# Patient Record
Sex: Male | Born: 1990 | Race: White | Hispanic: No | Marital: Single | Smoking: Former smoker
Health system: Southern US, Community
[De-identification: ages and names within clinical notes are randomized; demographics above are authoritative.]

## PROBLEM LIST (undated history)

## (undated) HISTORY — PX: COLONOSCOPY: SHX174

---
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
## 2008-09-16 ENCOUNTER — Ambulatory Visit: Payer: Self-pay | Admitting: Family Medicine

## 2008-11-04 ENCOUNTER — Ambulatory Visit: Payer: Self-pay | Admitting: Internal Medicine
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
## 2013-11-27 ENCOUNTER — Ambulatory Visit: Payer: Self-pay | Admitting: Gastroenterology

## 2016-05-06 ENCOUNTER — Encounter: Payer: Self-pay | Admitting: Gynecology

## 2016-05-06 ENCOUNTER — Ambulatory Visit
Admission: EM | Admit: 2016-05-06 | Discharge: 2016-05-06 | Disposition: A | Discharge status: Home / Self Care | Payer: BLUE CROSS/BLUE SHIELD | Attending: Internal Medicine | Admitting: Internal Medicine

## 2016-05-06 DIAGNOSIS — L551 Sunburn of second degree: Secondary | ICD-10-CM | POA: Diagnosis not present

## 2016-05-06 MED ORDER — CEPHALEXIN 250 MG PO CAPS: 250.0000 mg | ORAL_CAPSULE | Freq: Four times a day (QID) | ORAL | Status: DC
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
## 2016-05-06 NOTE — Discharge Instructions (Signed)
Sunburn  Sunburn is skin damage from being out in the sun too long. If you have light or fair skin, you may get sunburned more easily. Getting sunburned over and over can cause wrinkles and dark spots on the skin (sun spots). It can also increase your chance of getting skin cancer. HOME CARE  Avoid being out in the sun until your sunburn is gone.  Take a cool bath to help lessen pain. Put a cold, damp washcloth on the sunburn to help lessen pain. Do not put ice on the sunburn.  Only take medicine as told by your doctor.  Use sunburn creams or gels on your skin but not on blisters.  Drink enough fluids to keep your pee (urine) clear or pale yellow.  Do not break blisters. If blisters break, your doctor may tell you to use a medicated cream on the area. To keep from getting sunburned:  Avoid the sun between 10:00 a.m. and 4:00 p.m. during the day.  Put sunscreen on 30 minutes before being in the sun.  Wear a hat, clothing, and sunglasses to protect against the sun.  Avoid medicines, herbs, and foods that make you more sensitive to sun.  Avoid tanning beds. GET HELP RIGHT AWAY IF:  You have a fever.  You have pain and medicine does not help.  You throw up (vomit) or have watery poop (diarrhea).  You feel like you will pass out (faint).  You have a headache and feel confused.  You have very bad blisters.  You have yellowish-white fluid (pus) coming from your blisters.  Your burn gets more painful and puffy (swollen). MAKE SURE YOU:  Understand these instructions.  Will watch your condition.  Will get help right away if you are not doing well or get worse.   This information is not intended to replace advice given to you by your health care provider. Make sure you discuss any questions you have with your health care provider.   Document Released: 08/16/2011 Document Revised: 03/31/2013 Document Reviewed: 06/07/2015 Elsevier Interactive Patient Education 2016 [employer]  
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
## 2017-08-16 ENCOUNTER — Encounter: Payer: Self-pay | Admitting: Emergency Medicine

## 2017-08-16 ENCOUNTER — Ambulatory Visit
Admission: EM | Admit: 2017-08-16 | Discharge: 2017-08-16 | Disposition: A | Discharge status: Home / Self Care | Payer: BLUE CROSS/BLUE SHIELD

## 2017-08-16 DIAGNOSIS — R220 Localized swelling, mass and lump, head: Secondary | ICD-10-CM

## 2017-08-16 DIAGNOSIS — R030 Elevated blood-pressure reading, without diagnosis of hypertension: Secondary | ICD-10-CM | POA: Diagnosis not present

## 2017-08-16 DIAGNOSIS — S161XXA Strain of muscle, fascia and tendon at neck level, initial encounter: Secondary | ICD-10-CM

## 2017-08-16 DIAGNOSIS — I10 Essential (primary) hypertension: Secondary | ICD-10-CM

## 2017-08-16 NOTE — ED Triage Notes (Signed)
Patient c/o facial swelling and pain on the right side that started this morning.

## 2017-08-16 NOTE — ED Provider Notes (Signed)
[facility]    CSN: [id number] Arrival date & time: 08/16/17  0818     History   Chief Complaint Chief Complaint  Patient presents with  . Facial Swelling    HPI Jose Butler is a 26 y.o. male.   HPI  A 26 year old male who presents with swelling over the right side of his neck just below his ear started this morning. He states that today he awoke with pain on that side went into the bathroom looked in the mirror and the swelling was quite significant according to the patient. From that time until now the  the swelling has decreased as has the pain. He came in to have it "just checked out". With further questioning the patient states that he has noticed pain on the side of his knee last week or so and also had some intrascapular pain as well. He states that his neck was stiff when he turned to the right is been under intense stress at work.He denies any upper extremity radicular symptoms and has had no neurological abnormalities.  His blood pressure was notably elevated today. The patient does not have a primary care physician but is encouraged to find one to assess trends in his blood pressure. Is a negative family history of blood pressure and his parents.      History reviewed. No pertinent past medical history.  There are no active problems to display for this patient.   Past Surgical History:  Procedure Laterality Date  . COLONOSCOPY         Home Medications    Prior to Admission medications   Not on File    Family History History reviewed. No pertinent family history.  Social History Social History  Substance Use Topics  . Smoking status: Former Games developermoker  . Smokeless tobacco: Never Used  . Alcohol use Yes     Allergies   Patient has no known allergies.   Review of Systems Review of Systems  Constitutional: Negative for activity change, chills, fatigue and fever.  Musculoskeletal: Positive for myalgias, neck pain and neck stiffness.    All other systems reviewed and are negative.    Physical Exam Triage Vital Signs ED Triage Vitals  Enc Vitals Group     BP 08/16/17 0827 (!) 160/98     Pulse Rate 08/16/17 0827 89     Resp 08/16/17 0827 16     Temp 08/16/17 0827 97.9 F (36.6 C)     Temp Source 08/16/17 0827 Oral     SpO2 08/16/17 0827 100 %     Weight 08/16/17 0826 280 lb (127 kg)     Height 08/16/17 0826 5\' 10"  (1.778 m)     Head Circumference --      Peak Flow --      Pain Score 08/16/17 0826 5     Pain Loc --      Pain Edu? --      Excl. in GC? --    No data found.   Updated Vital Signs BP (!) 150/93 (BP Location: Left Arm)   Pulse 89   Temp 97.9 F (36.6 C) (Oral)   Resp 16   Ht 5\' 10"  (1.778 m)   Wt 280 lb (127 kg)   SpO2 100%   BMI 40.18 kg/m   Visual Acuity Right Eye Distance:   Left Eye Distance:   Bilateral Distance:    Right Eye Near:   Left Eye Near:    Bilateral Near:  Physical Exam  Constitutional: He appears well-developed and well-nourished. No distress.  HENT:  Head: Normocephalic.  Right Ear: External ear normal.  Left Ear: External ear normal.  Nose: Nose normal.  Mouth/Throat: Oropharynx is clear and moist. No oropharyngeal exudate.  Eyes: Pupils are equal, round, and reactive to light. Right eye exhibits no discharge. Left eye exhibits no discharge.  Neck: Neck supple.  Examination of the cervical spine shows mild limitation of motion approximate 5-10 to rightward rotation. Otherwise his range of motion is full. He has no palpable muscle spasm and no tenderness along the paraspinous muscles or of the sternocleidomastoid group. Upper extremity strength and sensation is intact to clinical testing. Cranial nerves are grossly intact.  Skin: He is not diaphoretic.  Nursing note and vitals reviewed.    UC Treatments / Results  Labs (all labs ordered are listed, but only abnormal results are displayed) Labs Reviewed - No data to display  EKG  EKG  Interpretation None       Radiology No results found.  Procedures Procedures (including critical care time)  Medications Ordered in UC Medications - No data to display   Initial Impression / Assessment and Plan / UC Course  I have reviewed the triage vital signs and the nursing notes.  Pertinent labs & imaging results that were available during my care of the patient were reviewed by me and considered in my medical decision making (see chart for details).     Plan: 1. Test/x-ray results and diagnosis reviewed with patient 2. rx as per orders; risks, benefits, potential side effects reviewed with patient 3. Recommend supportive treatment with use of Naprosyn or ibuprofen for one week to see if this will completely heal his spasm. I recommended highly that he arrange appointment with a primary care physician to follow-up with his elevated blood pressure  4. F/u prn if symptoms worsen or don't improve   Final Clinical Impressions(s) / UC Diagnoses   Final diagnoses:  Right facial swelling  Cervical strain, acute, initial encounter  Hypertension, unspecified type    New Prescriptions There are no discharge medications for this patient.    Controlled Substance Prescriptions [place] Controlled Substance Registry consulted? Not Applicable   Lutricia Feil, PA-C 08/16/17 1308

## 2020-12-22 ENCOUNTER — Ambulatory Visit
Admission: EM | Admit: 2020-12-22 | Discharge: 2020-12-22 | Disposition: A | Discharge status: Home / Self Care | Payer: 59

## 2020-12-22 ENCOUNTER — Other Ambulatory Visit: Payer: Self-pay

## 2023-02-08 ENCOUNTER — Other Ambulatory Visit: Payer: Self-pay | Admitting: Obstetrics and Gynecology

## 2023-02-08 DIAGNOSIS — Z3169 Encounter for other general counseling and advice on procreation: Secondary | ICD-10-CM

## 2023-02-08 NOTE — Progress Notes (Signed)
Doing fertility testing for pt and his wife. Quentin Cornwall [birth year]. MRN: [id number]

## 2024-10-21 ENCOUNTER — Other Ambulatory Visit: Payer: Self-pay | Admitting: Physician Assistant

## 2024-10-21 DIAGNOSIS — N5089 Other specified disorders of the male genital organs: Secondary | ICD-10-CM

## 2024-10-23 ENCOUNTER — Ambulatory Visit
Admission: RE | Admit: 2024-10-23 | Discharge: 2024-10-23 | Disposition: A | Discharge status: Home / Self Care | Source: Ambulatory Visit | Attending: Physician Assistant | Admitting: Physician Assistant

## 2024-10-23 DIAGNOSIS — N5089 Other specified disorders of the male genital organs: Secondary | ICD-10-CM | POA: Diagnosis present
